# Patient Record
Sex: Female | Born: 1970 | Race: Black or African American | Hispanic: No | Marital: Single | Smoking: Current every day smoker
Health system: Southern US, Community
[De-identification: ages and names within clinical notes are randomized; demographics above are authoritative.]

## PROBLEM LIST (undated history)

## (undated) DIAGNOSIS — Z21 Asymptomatic human immunodeficiency virus [HIV] infection status: Secondary | ICD-10-CM

## (undated) DIAGNOSIS — B20 Human immunodeficiency virus [HIV] disease: Secondary | ICD-10-CM

---

## 2020-07-20 ENCOUNTER — Encounter (HOSPITAL_BASED_OUTPATIENT_CLINIC_OR_DEPARTMENT_OTHER): Payer: Self-pay | Admitting: Emergency Medicine

## 2020-07-20 ENCOUNTER — Emergency Department (HOSPITAL_BASED_OUTPATIENT_CLINIC_OR_DEPARTMENT_OTHER)
Admission: EM | Admit: 2020-07-20 | Discharge: 2020-07-20 | Disposition: A | Discharge status: Home / Self Care | Payer: Medicaid Other | Attending: Emergency Medicine | Admitting: Emergency Medicine

## 2020-07-20 ENCOUNTER — Telehealth (HOSPITAL_BASED_OUTPATIENT_CLINIC_OR_DEPARTMENT_OTHER): Payer: Self-pay | Admitting: *Deleted

## 2020-07-20 ENCOUNTER — Other Ambulatory Visit: Payer: Self-pay

## 2020-07-20 DIAGNOSIS — F17200 Nicotine dependence, unspecified, uncomplicated: Secondary | ICD-10-CM | POA: Diagnosis not present

## 2020-07-20 DIAGNOSIS — U071 COVID-19: Secondary | ICD-10-CM | POA: Diagnosis not present

## 2020-07-20 DIAGNOSIS — J069 Acute upper respiratory infection, unspecified: Secondary | ICD-10-CM | POA: Diagnosis not present

## 2020-07-20 DIAGNOSIS — Z21 Asymptomatic human immunodeficiency virus [HIV] infection status: Secondary | ICD-10-CM | POA: Diagnosis not present

## 2020-07-20 DIAGNOSIS — R05 Cough: Secondary | ICD-10-CM | POA: Diagnosis present

## 2020-07-20 HISTORY — DX: Human immunodeficiency virus (HIV) disease: B20

## 2020-07-20 HISTORY — DX: Asymptomatic human immunodeficiency virus (hiv) infection status: Z21

## 2020-07-20 LAB — SARS CORONAVIRUS 2 BY RT PCR (HOSPITAL ORDER, PERFORMED IN ~~LOC~~ HOSPITAL LAB): SARS Coronavirus 2: POSITIVE — AB

## 2020-07-20 MED ORDER — ACETAMINOPHEN 325 MG PO TABS
650.0000 mg | ORAL_TABLET | Freq: Once | ORAL | Status: AC
  Administered 2020-07-20: 650 mg via ORAL
  Filled 2020-07-20: qty 2

## 2020-07-20 NOTE — ED Provider Notes (Signed)
[facility] EMERGENCY DEPARTMENT Provider Note   CSN: [id number] Arrival date & time: 07/20/20  1721     History Chief Complaint  Patient presents with  . Generalized Body Aches    Jenna Jenkins is a 49 y.o. female.  49 year old female presents with complaint of cough, congestion, body aches, diarrhea.  Patient states symptoms started yesterday, exposed to her daughter who she thought just had a cold.  Patient is taking Alka-Seltzer yesterday without relief of her symptoms, continued to feel unwell today so she came to be seen.  Patient has not had her Covid vaccine, no other sick contacts.  Patient has a history of HIV, compliant with medication and follow-up with good control.  No history of asthma or chronic lung disease.        Past Medical History:  Diagnosis Date  . HIV (human immunodeficiency virus infection) (HCC)     There are no problems to display for this patient.   History reviewed. No pertinent surgical history.   OB History   No obstetric history on file.     No family history on file.  Social History   Tobacco Use  . Smoking status: Current Every Day Smoker  . Smokeless tobacco: Never Used  Substance Use Topics  . Alcohol use: Never  . Drug use: Never    Home Medications Prior to Admission medications   Not on File    Allergies    Patient has no known allergies.  Review of Systems   Review of Systems  Constitutional: Positive for chills. Negative for fever.  HENT: Positive for congestion. Negative for sore throat.   Respiratory: Positive for cough. Negative for shortness of breath.   Gastrointestinal: Positive for diarrhea. Negative for abdominal pain, nausea and vomiting.  Genitourinary: Negative for dysuria.  Musculoskeletal: Positive for arthralgias and myalgias.  Skin: Negative for rash and wound.  Allergic/Immunologic: Positive for immunocompromised state.  Neurological: Negative for weakness.  Hematological: Negative  for adenopathy.  All other systems reviewed and are negative.   Physical Exam Updated Vital Signs BP 115/78 (BP Location: Right Arm)   Pulse 99   Temp 98.9 F (37.2 C) (Oral)   Resp 18   Ht 6\' 1"  (1.854 m)   Wt (!) 122.5 kg   SpO2 99%   BMI 35.62 kg/m   Physical Exam Vitals and nursing note reviewed.  Constitutional:      General: She is not in acute distress.    Appearance: She is well-developed. She is not diaphoretic.  HENT:     Head: Normocephalic and atraumatic.     Right Ear: Tympanic membrane and ear canal normal.     Left Ear: Tympanic membrane and ear canal normal.  Cardiovascular:     Rate and Rhythm: Normal rate and regular rhythm.     Pulses: Normal pulses.     Heart sounds: Normal heart sounds.  Pulmonary:     Effort: Pulmonary effort is normal.     Breath sounds: Normal breath sounds.  Musculoskeletal:     Right lower leg: No edema.     Left lower leg: No edema.  Skin:    General: Skin is warm and dry.     Findings: No erythema or rash.  Neurological:     Mental Status: She is alert and oriented to person, place, and time.  Psychiatric:        Behavior: Behavior normal.     ED Results / Procedures / Treatments   Labs (  all labs ordered are listed, but only abnormal results are displayed) Labs Reviewed  SARS CORONAVIRUS 2 BY RT PCR (HOSPITAL ORDER, PERFORMED IN [facility])    EKG None  Radiology No results found.  Procedures Procedures (including critical care time)  Medications Ordered in ED Medications  acetaminophen (TYLENOL) tablet 650 mg (has no administration in time range)    ED Course  I have reviewed the triage vital signs and the nursing notes.  Pertinent labs & imaging results that were available during my care of the patient were reviewed by me and considered in my medical decision making (see chart for details).  Clinical Course as of Jul 20 2034  Wynelle Link Jul 20, 2020  [id number] 49 year old female with complaint  of cough, body aches, diarrhea. No loss of smell or taste. On exam, patient is well appearing, lungs clear, vitals reassuring. Patient will be tested for COVID, advised to quarantine pending her results.     Jenna Jenkins was evaluated in Emergency Department on 07/20/2020 for the symptoms described in the history of present illness. She was evaluated in the context of the global COVID-19 pandemic, which necessitated consideration that the patient might be at risk for infection with the SARS-CoV-2 virus that causes COVID-19. Institutional protocols and algorithms that pertain to the evaluation of patients at risk for COVID-19 are in a state of rapid change based on information released by regulatory bodies including the CDC and federal and state organizations. These policies and algorithms were followed during the patient's care in the ED.     [LM]    Clinical Course User Index [LM] Alden Hipp   MDM Rules/Calculators/A&P                          Final Clinical Impression(s) / ED Diagnoses Final diagnoses:  Viral upper respiratory tract infection    Rx / DC Orders ED Discharge Orders    None       Jeannie Fend, PA-C 07/20/20 2035    Melene Plan, DO 07/20/20 2041

## 2020-07-20 NOTE — Discharge Instructions (Addendum)
Home to rest and quarantine pending your Covid test results.  Take Tylenol as needed as directed for body aches. Follow-up with your doctor for worsening or concerning symptoms, return to ER as needed.

## 2020-07-20 NOTE — ED Triage Notes (Signed)
Patient states that her daughter recently had a "cold" and the patient started to feel bad yesterday. The patient states that this am she woke up feeling worse with generalized body. Denies any N/V, chills.

## 2020-07-20 NOTE — ED Notes (Signed)
Pt reports shortness of breath, requesting oxygen in lobby. Triage chart reviewed, portable pulse ox utilized to examine patient in lobby. Speaking in full sentences, breathing easy and unlabored. HR 99, SpO2 on room air 98-99%. Explained wait times to patient, who wants to go home ow. Encouraged to stay as wait times vary quickly. Pt agreeable.

## 2020-07-22 ENCOUNTER — Other Ambulatory Visit: Payer: Self-pay | Admitting: Adult Health

## 2020-07-22 DIAGNOSIS — U071 COVID-19: Secondary | ICD-10-CM

## 2020-07-22 NOTE — Progress Notes (Signed)
I connected by phone with Jenna Jenkins on 07/22/2020 at 8:24 PM to discuss the potential use of a new treatment for mild to moderate COVID-19 viral infection in non-hospitalized patients.  This patient is a 49 y.o. female that meets the FDA criteria for Emergency Use Authorization of COVID monoclonal antibody casirivimab/imdevimab.  Has a (+) direct SARS-CoV-2 viral test result  Has mild or moderate COVID-19   Is NOT hospitalized due to COVID-19  Is within 10 days of symptom onset  Has at least one of the high risk factor(s) for progression to severe COVID-19 and/or hospitalization as defined in EUA.  Specific high risk criteria : BMI > 25   I have spoken and communicated the following to the patient or parent/caregiver regarding COVID monoclonal antibody treatment:  1. FDA has authorized the emergency use for the treatment of mild to moderate COVID-19 in adults and pediatric patients with positive results of direct SARS-CoV-2 viral testing who are 47 years of age and older weighing at least 40 kg, and who are at high risk for progressing to severe COVID-19 and/or hospitalization.  2. The significant known and potential risks and benefits of COVID monoclonal antibody, and the extent to which such potential risks and benefits are unknown.  3. Information on available alternative treatments and the risks and benefits of those alternatives, including clinical trials.  4. Patients treated with COVID monoclonal antibody should continue to self-isolate and use infection control measures (e.g., wear mask, isolate, social distance, avoid sharing personal items, clean and disinfect "high touch" surfaces, and frequent handwashing) according to CDC guidelines.   5. The patient or parent/caregiver has the option to accept or refuse COVID monoclonal antibody treatment.  After reviewing this information with the patient, The patient agreed to proceed with receiving casirivimab\imdevimab infusion and  will be provided a copy of the Fact sheet prior to receiving the infusion. Noreene Filbert 07/22/2020 8:24 PM

## 2020-07-23 MED ORDER — SODIUM CHLORIDE 0.9 % IV SOLN
Freq: Once | INTRAVENOUS | Status: AC
  Filled 2020-07-23: qty 600

## 2020-07-24 ENCOUNTER — Ambulatory Visit (HOSPITAL_COMMUNITY)
Admission: RE | Admit: 2020-07-24 | Discharge: 2020-07-24 | Disposition: A | Discharge status: Home / Self Care | Payer: Medicaid Other | Source: Ambulatory Visit | Attending: Pulmonary Disease | Admitting: Pulmonary Disease

## 2020-07-24 DIAGNOSIS — U071 COVID-19: Secondary | ICD-10-CM | POA: Diagnosis not present

## 2020-07-24 MED ORDER — DIPHENHYDRAMINE HCL 50 MG/ML IJ SOLN: 50.0000 mg | Freq: Once | INTRAMUSCULAR | Status: DC | PRN

## 2020-07-24 MED ORDER — SODIUM CHLORIDE 0.9 % IV SOLN: INTRAVENOUS | Status: DC | PRN

## 2020-07-24 MED ORDER — FAMOTIDINE IN NACL 20-0.9 MG/50ML-% IV SOLN: 20.0000 mg | Freq: Once | INTRAVENOUS | Status: DC | PRN

## 2020-07-24 MED ORDER — EPINEPHRINE 0.3 MG/0.3ML IJ SOAJ: 0.3000 mg | Freq: Once | INTRAMUSCULAR | Status: DC | PRN

## 2020-07-24 MED ORDER — ALBUTEROL SULFATE HFA 108 (90 BASE) MCG/ACT IN AERS: 2.0000 | INHALATION_SPRAY | Freq: Once | RESPIRATORY_TRACT | Status: DC | PRN

## 2020-07-24 MED ORDER — METHYLPREDNISOLONE SODIUM SUCC 125 MG IJ SOLR: 125.0000 mg | Freq: Once | INTRAMUSCULAR | Status: DC | PRN

## 2020-07-24 NOTE — Progress Notes (Signed)
°  Diagnosis: COVID-19 ° °Physician:Dr Wright ° °Procedure: [facility] Infusion [facility] Med: casirivimab\imdevimab infusion - Provided patient with casirivimab\imdevimab fact sheet for patients, parents and caregivers prior to infusion. ° °Complications: No immediate complications noted. ° °Discharge: Discharged home  ° °Jenna Jenkins °07/24/2020 ° °

## 2020-07-24 NOTE — Discharge Instructions (Signed)

## 2020-10-24 ENCOUNTER — Encounter (HOSPITAL_BASED_OUTPATIENT_CLINIC_OR_DEPARTMENT_OTHER): Payer: Self-pay | Admitting: Emergency Medicine

## 2020-10-24 ENCOUNTER — Emergency Department (HOSPITAL_BASED_OUTPATIENT_CLINIC_OR_DEPARTMENT_OTHER): Payer: Medicaid Other

## 2020-10-24 ENCOUNTER — Emergency Department (HOSPITAL_BASED_OUTPATIENT_CLINIC_OR_DEPARTMENT_OTHER)
Admission: EM | Admit: 2020-10-24 | Discharge: 2020-10-24 | Disposition: A | Discharge status: Home / Self Care | Payer: Medicaid Other | Attending: Emergency Medicine | Admitting: Emergency Medicine

## 2020-10-24 ENCOUNTER — Other Ambulatory Visit: Payer: Self-pay

## 2020-10-24 DIAGNOSIS — S93401A Sprain of unspecified ligament of right ankle, initial encounter: Secondary | ICD-10-CM | POA: Insufficient documentation

## 2020-10-24 DIAGNOSIS — W108XXA Fall (on) (from) other stairs and steps, initial encounter: Secondary | ICD-10-CM | POA: Diagnosis not present

## 2020-10-24 DIAGNOSIS — F17200 Nicotine dependence, unspecified, uncomplicated: Secondary | ICD-10-CM | POA: Diagnosis not present

## 2020-10-24 DIAGNOSIS — S99911A Unspecified injury of right ankle, initial encounter: Secondary | ICD-10-CM | POA: Diagnosis present

## 2020-10-24 DIAGNOSIS — B20 Human immunodeficiency virus [HIV] disease: Secondary | ICD-10-CM | POA: Insufficient documentation

## 2020-10-24 MED ORDER — NAPROXEN 500 MG PO TABS: 500.0000 mg | ORAL_TABLET | Freq: Two times a day (BID) | ORAL | 0 refills | Status: AC

## 2020-10-24 MED ORDER — NAPROXEN 250 MG PO TABS
500.0000 mg | ORAL_TABLET | Freq: Once | ORAL | Status: AC
  Administered 2020-10-24: 500 mg via ORAL
  Filled 2020-10-24: qty 2

## 2020-10-24 NOTE — ED Provider Notes (Signed)
[facility] EMERGENCY DEPARTMENT Provider Note   CSN: [id number] Arrival date & time: 10/24/20  1014     History Chief Complaint  Patient presents with   Ankle Pain   Fall    Jenna Jenkins is a 49 y.o. female.  Patient presents emergency department for evaluation of right ankle pain starting acutely last night after she fell down 7 or 8 steps.  Patient states that she landed on the ground and twisted her right ankle.  She has had progressively worsening pain in the medial ankle since that time with associated swelling.  No numbness or tingling.  No knee or hip pain.  She did "scrape" her knee.  She denies head or neck injury.  No upper extremity injuries, chest pain, abdominal pain.  No treatments prior to arrival.  Patient states that she can barely put any weight on her right foot.        Past Medical History:  Diagnosis Date   HIV (human immunodeficiency virus infection) (HCC)     There are no problems to display for this patient.   History reviewed. No pertinent surgical history.   OB History   No obstetric history on file.     No family history on file.  Social History   Tobacco Use   Smoking status: Current Every Day Smoker   Smokeless tobacco: Never Used  Substance Use Topics   Alcohol use: Never   Drug use: Never    Home Medications Prior to Admission medications   Not on File    Allergies    Patient has no known allergies.  Review of Systems   Review of Systems  Constitutional: Negative for activity change.  Musculoskeletal: Positive for arthralgias, gait problem and joint swelling. Negative for back pain and neck pain.  Skin: Negative for wound.  Neurological: Negative for weakness and numbness.    Physical Exam Updated Vital Signs BP (!) 143/97 (BP Location: Right Arm)    Pulse 82    Temp 98.2 F (36.8 C)    Resp 18    Ht 6\' 1"  (1.854 m)    Wt 117.5 kg    SpO2 98%    BMI 34.17 kg/m   Physical Exam Vitals and nursing  note reviewed.  Constitutional:      Appearance: She is well-developed.  HENT:     Head: Normocephalic and atraumatic.  Eyes:     Pupils: Pupils are equal, round, and reactive to light.  Cardiovascular:     Pulses: Normal pulses. No decreased pulses.  Musculoskeletal:        General: Tenderness present.     Cervical back: Normal range of motion and neck supple.     Right knee: Normal range of motion. No tenderness.     Right ankle: Swelling (Mild medially) present. No deformity. Tenderness present over the medial malleolus. No proximal fibula tenderness. Decreased range of motion. Normal pulse.  Skin:    General: Skin is warm and dry.  Neurological:     Mental Status: She is alert.     Sensory: No sensory deficit.     Comments: Motor, sensation, and vascular distal to the injury is fully intact.      ED Results / Procedures / Treatments   Labs (all labs ordered are listed, but only abnormal results are displayed) Labs Reviewed - No data to display  EKG None  Radiology No results found.  Procedures Procedures (including critical care time)  Medications Ordered in ED Medications -  No data to display  ED Course  I have reviewed the triage vital signs and the nursing notes.  Pertinent labs & imaging results that were available during my care of the patient were reviewed by me and considered in my medical decision making (see chart for details).  Patient seen and examined.   Vital signs reviewed and are as follows: BP (!) 143/97 (BP Location: Right Arm)    Pulse 82    Temp 98.2 F (36.8 C)    Resp 18    Ht 6\' 1"  (1.854 m)    Wt 117.5 kg    SpO2 98%    BMI 34.17 kg/m   Plan: X-ray  If negative, will place patient in ASO, recommend rice protocol, sports medicine follow-up as needed.  Patient in agreement.  11:17 AM x-ray negative, patient informed of results.  Plan as above.    MDM Rules/Calculators/A&P                          Patient with ankle sprain after a  fall down some stairs yesterday.  No indication of head, neck, chest, or abdomen injury.  Imaging with plain film x-rays negative.  Lower extremity is neurovascularly intact.  Conservative measures as above.   Final Clinical Impression(s) / ED Diagnoses Final diagnoses:  Sprain of right ankle, unspecified ligament, initial encounter    Rx / DC Orders ED Discharge Orders    None       , PA-C 10/24/20 1120    10/26/20, MD 10/24/20 1551

## 2020-10-24 NOTE — Discharge Instructions (Signed)
Please read and follow all provided instructions.  Your diagnoses today include:  1. Sprain of right ankle, unspecified ligament, initial encounter     Tests performed today include:  An x-ray of your ankle - does NOT show any broken bones  Vital signs. See below for your results today.   Medications prescribed:   Naproxen - anti-inflammatory pain medication  Do not exceed 500mg  naproxen every 12 hours, take with food  You have been prescribed an anti-inflammatory medication or NSAID. Take with food. Take smallest effective dose for the shortest duration needed for your pain. Stop taking if you experience stomach pain or vomiting.   Take any prescribed medications only as directed.  Home care instructions:   Follow any educational materials contained in this packet  Follow R.I.C.E. Protocol:  R - rest your injury   I  - use ice on injury without applying directly to skin  C - compress injury with bandage or splint  E - elevate the injury as much as possible  Follow-up instructions: Please follow-up with your primary care provider or the provided orthopedic (bone specialist) if you continue to have significant pain or trouble walking in 1 week. In this case you may have a severe sprain that requires further care.   Return instructions:   Please return if your toes are numb or tingling, appear gray or blue, or you have severe pain (also elevate leg and loosen splint or wrap)  Please return to the Emergency Department if you experience worsening symptoms.   Please return if you have any other emergent concerns.  Additional Information:  Your vital signs today were: BP (!) 143/97 (BP Location: Right Arm)   Pulse 82   Temp 98.2 F (36.8 C)   Resp 18   Ht 6\' 1"  (1.854 m)   Wt 117.5 kg   SpO2 98%   BMI 34.17 kg/m  If your blood pressure (BP) was elevated above 135/85 this visit, please have this repeated by your doctor within one month. -------------- Your  caregiver has diagnosed you as suffering from an ankle sprain. Ankle sprain occurs when the ligaments that hold the ankle joint together are stretched or torn. It may take 4 to 6 weeks to heal.  For Activity: If prescribed crutches, use crutches with non-weight bearing for the first few days. Then, you may walk on your ankle as the pain allows, or as instructed. Start gradually with weight bearing on the affected ankle. Once you can walk pain free, then try jogging. When you can run forwards, then you can try moving side-to-side. If you cannot walk without crutches in one week, you need a re-check. --------------

## 2020-10-24 NOTE — ED Triage Notes (Signed)
Reports she fell down about 8 steps yesterday.  C/o pain in right ankle with swelling noted.

## 2020-10-27 ENCOUNTER — Telehealth: Payer: Self-pay | Admitting: Family Medicine

## 2020-10-27 NOTE — Telephone Encounter (Signed)
Pt decline Ed follow- up appt , says feeling much better & has a boot--will contact office if foot starts hurting again  --gl

## 2020-12-25 ENCOUNTER — Other Ambulatory Visit: Payer: Self-pay

## 2020-12-25 ENCOUNTER — Emergency Department (HOSPITAL_BASED_OUTPATIENT_CLINIC_OR_DEPARTMENT_OTHER)
Admission: EM | Admit: 2020-12-25 | Discharge: 2020-12-25 | Disposition: A | Discharge status: Home / Self Care | Payer: Medicaid Other | Attending: Emergency Medicine | Admitting: Emergency Medicine

## 2020-12-25 ENCOUNTER — Encounter (HOSPITAL_BASED_OUTPATIENT_CLINIC_OR_DEPARTMENT_OTHER): Payer: Self-pay | Admitting: *Deleted

## 2020-12-25 DIAGNOSIS — Z5321 Procedure and treatment not carried out due to patient leaving prior to being seen by health care provider: Secondary | ICD-10-CM | POA: Insufficient documentation

## 2020-12-25 DIAGNOSIS — Z20822 Contact with and (suspected) exposure to covid-19: Secondary | ICD-10-CM | POA: Diagnosis present

## 2020-12-25 NOTE — ED Triage Notes (Signed)
covid exposure with Covid symptoms

## 2022-06-19 IMAGING — DX DG ANKLE COMPLETE 3+V*R*
3 series · 3 of 3 positions shown · non-contrast
Comparison: None.

CLINICAL DATA: Pain following fall

EXAM:
RIGHT ANKLE - COMPLETE 3+ VIEW

[ankle ap]
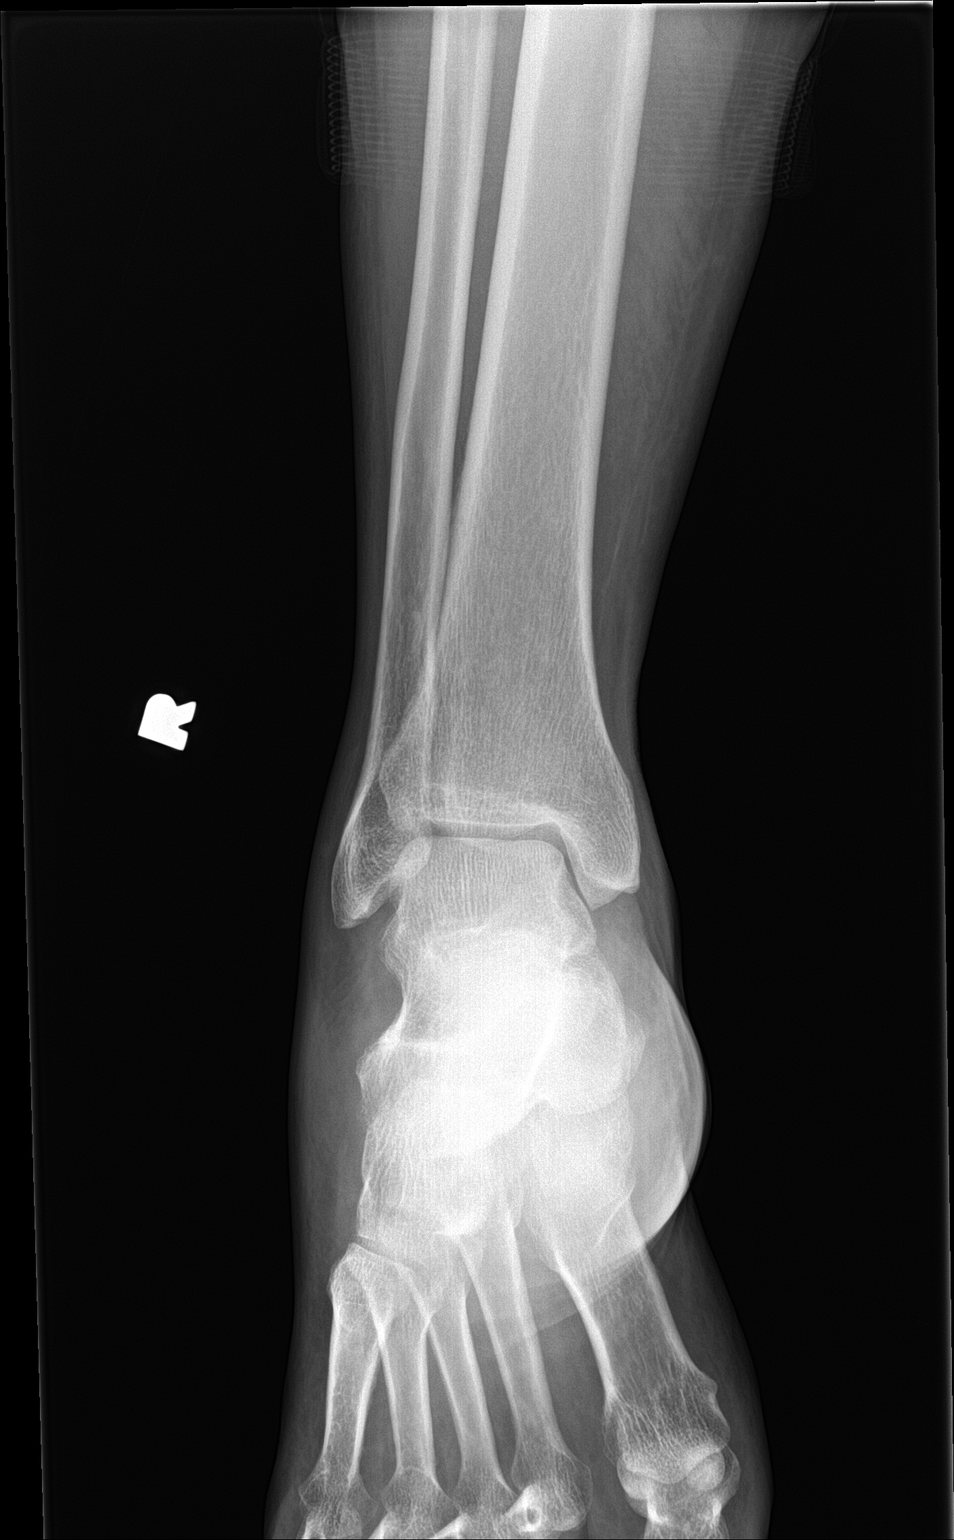

[ankle obl]
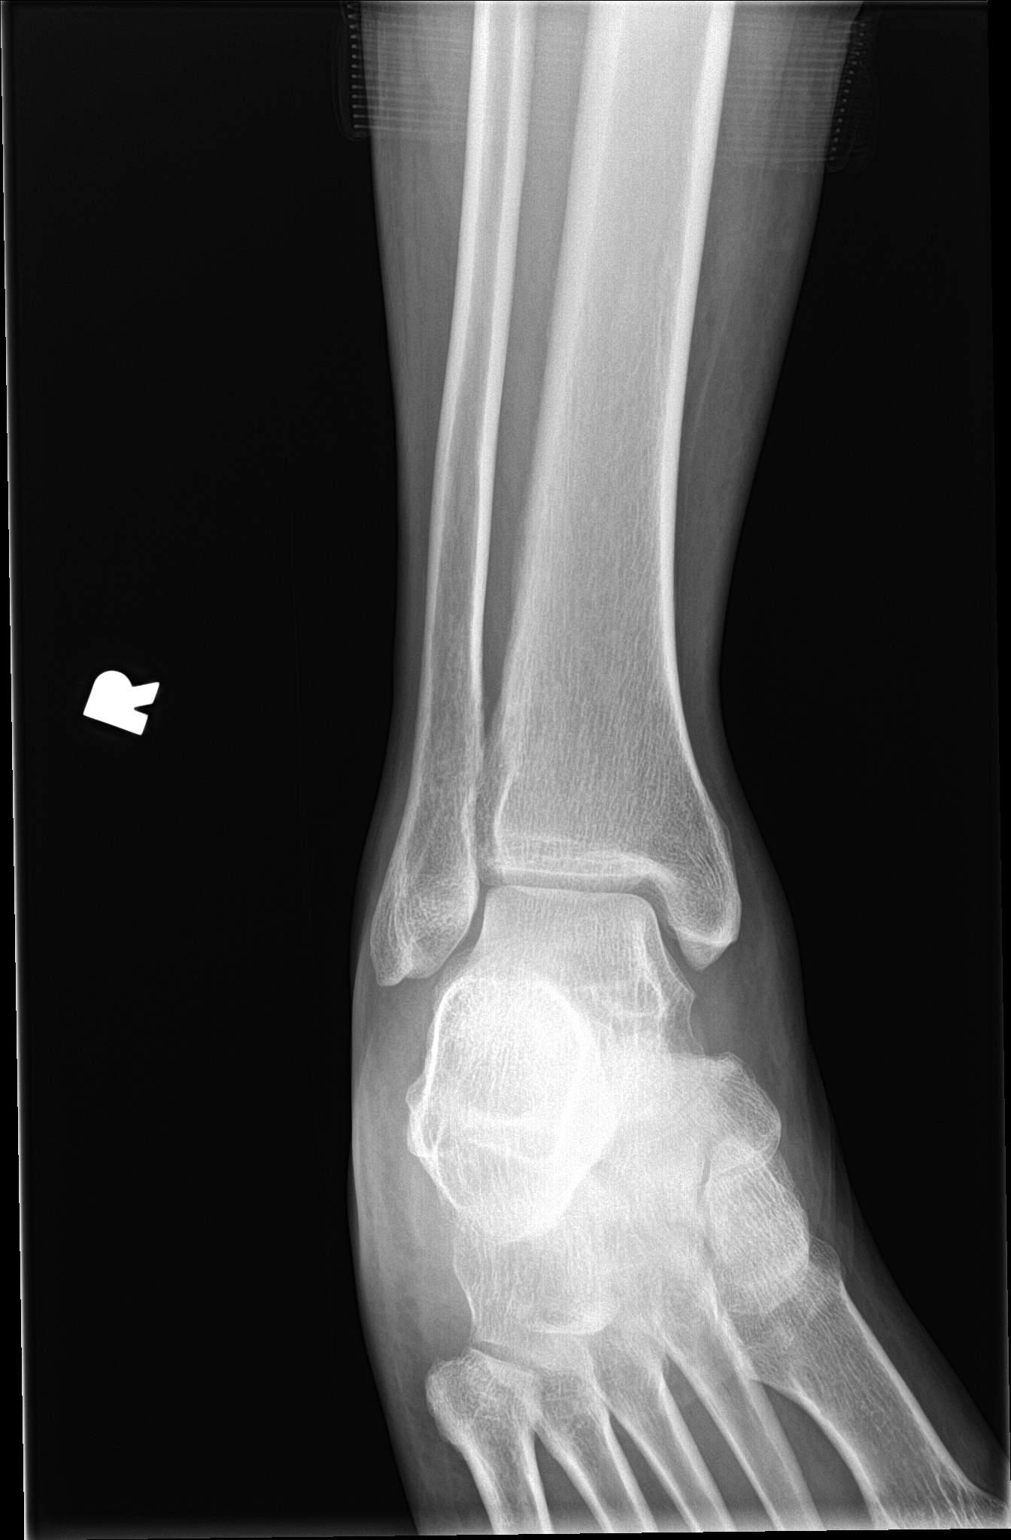

[ankle lat]
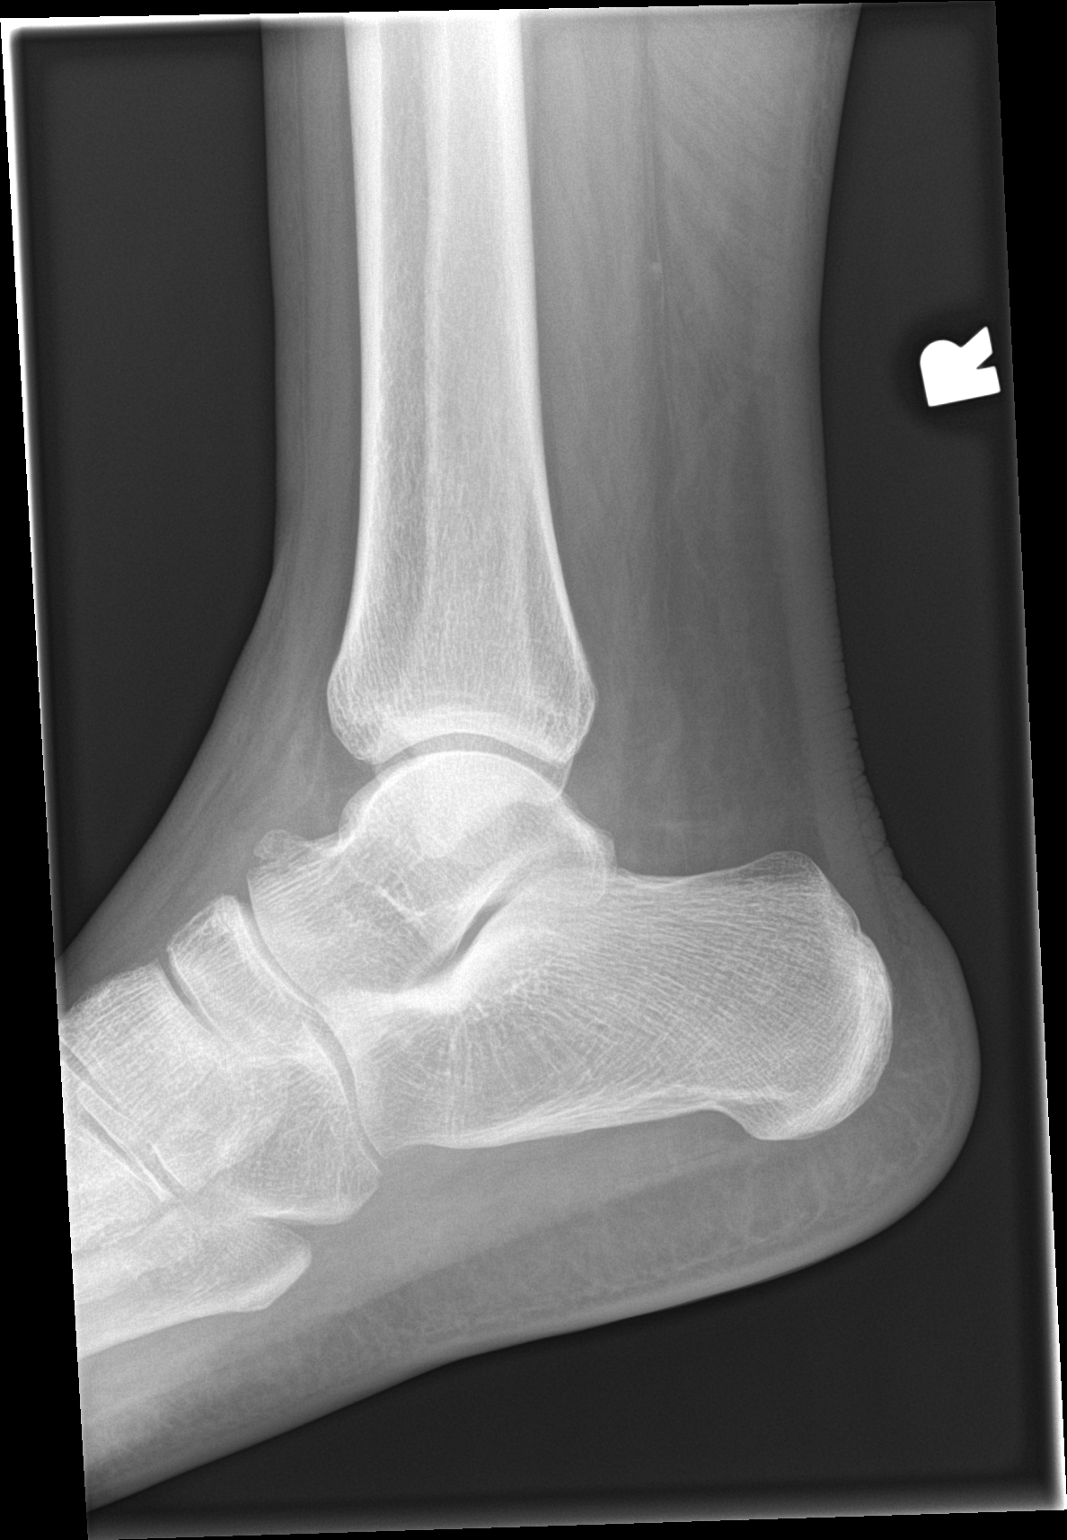

[3 of 3 positions shown; findings below may reference images not displayed]

FINDINGS: Frontal, oblique, and lateral views were obtained. There is no
evident fracture or joint effusion. No joint space narrowing or
erosion. There is a benign exostosis arising from the dorsal distal
talus measuring 8 x 6 mm. Ankle mortise appears intact.
IMPRESSION: No evident fracture or appreciable arthropathy. Small benign
exostosis arising from the dorsal distal talus. Ankle mortise
appears intact.

## 2022-09-14 ENCOUNTER — Encounter (HOSPITAL_BASED_OUTPATIENT_CLINIC_OR_DEPARTMENT_OTHER): Payer: Self-pay | Admitting: Emergency Medicine

## 2022-09-14 ENCOUNTER — Emergency Department (HOSPITAL_BASED_OUTPATIENT_CLINIC_OR_DEPARTMENT_OTHER)
Admission: EM | Admit: 2022-09-14 | Discharge: 2022-09-14 | Disposition: A | Discharge status: Home / Self Care | Payer: Medicaid Other | Attending: Emergency Medicine | Admitting: Emergency Medicine

## 2022-09-14 ENCOUNTER — Emergency Department (HOSPITAL_BASED_OUTPATIENT_CLINIC_OR_DEPARTMENT_OTHER): Payer: Medicaid Other

## 2022-09-14 DIAGNOSIS — Y92410 Unspecified street and highway as the place of occurrence of the external cause: Secondary | ICD-10-CM | POA: Insufficient documentation

## 2022-09-14 DIAGNOSIS — M62838 Other muscle spasm: Secondary | ICD-10-CM | POA: Insufficient documentation

## 2022-09-14 DIAGNOSIS — R519 Headache, unspecified: Secondary | ICD-10-CM | POA: Insufficient documentation

## 2022-09-14 DIAGNOSIS — R0602 Shortness of breath: Secondary | ICD-10-CM | POA: Insufficient documentation

## 2022-09-14 DIAGNOSIS — M542 Cervicalgia: Secondary | ICD-10-CM | POA: Diagnosis present

## 2022-09-14 MED ORDER — LIDOCAINE 5 % EX PTCH: 1.0000 | MEDICATED_PATCH | CUTANEOUS | 0 refills | Status: AC

## 2022-09-14 MED ORDER — CYCLOBENZAPRINE HCL 10 MG PO TABS: 10.0000 mg | ORAL_TABLET | Freq: Two times a day (BID) | ORAL | 0 refills | Status: AC | PRN
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
## 2023-03-30 ENCOUNTER — Other Ambulatory Visit: Payer: Self-pay | Admitting: Physician Assistant

## 2023-03-30 DIAGNOSIS — Z1231 Encounter for screening mammogram for malignant neoplasm of breast: Secondary | ICD-10-CM

## 2023-04-13 ENCOUNTER — Encounter: Payer: Self-pay | Admitting: *Deleted

## 2023-05-05 ENCOUNTER — Ambulatory Visit
Admission: RE | Admit: 2023-05-05 | Discharge: 2023-05-05 | Disposition: A | Discharge status: Home / Self Care | Payer: Medicaid Other | Source: Ambulatory Visit | Attending: Physician Assistant | Admitting: Physician Assistant

## 2023-05-05 DIAGNOSIS — Z1231 Encounter for screening mammogram for malignant neoplasm of breast: Secondary | ICD-10-CM

## 2023-11-08 LAB — HM PAP SMEAR

## 2024-01-13 ENCOUNTER — Emergency Department (HOSPITAL_BASED_OUTPATIENT_CLINIC_OR_DEPARTMENT_OTHER)
Admission: EM | Admit: 2024-01-13 | Discharge: 2024-01-13 | Disposition: A | Discharge status: Home / Self Care | Payer: Medicaid Other | Attending: Emergency Medicine | Admitting: Emergency Medicine

## 2024-01-13 ENCOUNTER — Other Ambulatory Visit: Payer: Self-pay

## 2024-01-13 ENCOUNTER — Encounter (HOSPITAL_BASED_OUTPATIENT_CLINIC_OR_DEPARTMENT_OTHER): Payer: Self-pay | Admitting: Emergency Medicine

## 2024-01-13 DIAGNOSIS — Z21 Asymptomatic human immunodeficiency virus [HIV] infection status: Secondary | ICD-10-CM | POA: Insufficient documentation

## 2024-01-13 DIAGNOSIS — Z20822 Contact with and (suspected) exposure to covid-19: Secondary | ICD-10-CM | POA: Diagnosis not present

## 2024-01-13 DIAGNOSIS — R197 Diarrhea, unspecified: Secondary | ICD-10-CM | POA: Insufficient documentation

## 2024-01-13 DIAGNOSIS — R6883 Chills (without fever): Secondary | ICD-10-CM | POA: Insufficient documentation

## 2024-01-13 DIAGNOSIS — R112 Nausea with vomiting, unspecified: Secondary | ICD-10-CM | POA: Diagnosis present

## 2024-01-13 DIAGNOSIS — M791 Myalgia, unspecified site: Secondary | ICD-10-CM | POA: Insufficient documentation

## 2024-01-13 LAB — URINALYSIS, ROUTINE W REFLEX MICROSCOPIC
Glucose, UA: NEGATIVE mg/dL
Hgb urine dipstick: NEGATIVE
Ketones, ur: NEGATIVE mg/dL
Leukocytes,Ua: NEGATIVE
Nitrite: NEGATIVE
Protein, ur: 30 mg/dL — AB
Specific Gravity, Urine: >=1.03 (ref 1.005–1.030)
pH: 6 (ref 5.0–8.0)

## 2024-01-13 LAB — RESP PANEL BY RT-PCR (RSV, FLU A&B, COVID)  RVPGX2
Influenza A by PCR: NEGATIVE
Influenza B by PCR: NEGATIVE
Resp Syncytial Virus by PCR: NEGATIVE
SARS Coronavirus 2 by RT PCR: NEGATIVE

## 2024-01-13 LAB — URINALYSIS, MICROSCOPIC (REFLEX)

## 2024-01-13 LAB — COMPREHENSIVE METABOLIC PANEL
ALT: 18 U/L (ref 0–44)
AST: 16 U/L (ref 15–41)
Albumin: 4 g/dL (ref 3.5–5.0)
Alkaline Phosphatase: 64 U/L (ref 38–126)
Anion gap: 10 (ref 5–15)
BUN: 9 mg/dL (ref 6–20)
CO2: 23 mmol/L (ref 22–32)
Calcium: 8.9 mg/dL (ref 8.9–10.3)
Chloride: 105 mmol/L (ref 98–111)
Creatinine, Ser: 0.96 mg/dL (ref 0.44–1.00)
GFR, Estimated: >60 mL/min (ref >60)
Glucose, Bld: 138 mg/dL — ABNORMAL HIGH (ref 70–99)
Potassium: 3.9 mmol/L (ref 3.5–5.1)
Sodium: 138 mmol/L (ref 135–145)
Total Bilirubin: 0.7 mg/dL (ref 0.0–1.2)
Total Protein: 7.7 g/dL (ref 6.5–8.1)

## 2024-01-13 LAB — CBC
HCT: 43.3 % (ref 36.0–46.0)
Hemoglobin: 14.3 g/dL (ref 12.0–15.0)
MCH: 30.8 pg (ref 26.0–34.0)
MCHC: 33 g/dL (ref 30.0–36.0)
MCV: 93.1 fL (ref 80.0–100.0)
Platelets: 242 10*3/uL (ref 150–400)
RBC: 4.65 MIL/uL (ref 3.87–5.11)
RDW: 12.9 % (ref 11.5–15.5)
WBC: 8.9 10*3/uL (ref 4.0–10.5)
nRBC: 0 % (ref 0.0–0.2)

## 2024-01-13 LAB — LIPASE, BLOOD: Lipase: 24 U/L (ref 11–51)

## 2024-01-13 MED ORDER — ONDANSETRON 4 MG PO TBDP: 4.0000 mg | ORAL_TABLET | Freq: Three times a day (TID) | ORAL | 0 refills | Status: AC | PRN

## 2024-01-13 MED ORDER — ONDANSETRON 4 MG PO TBDP
4.0000 mg | ORAL_TABLET | Freq: Once | ORAL | Status: AC
  Administered 2024-01-13: 4 mg via ORAL
  Filled 2024-01-13: qty 1

## 2024-01-13 NOTE — Discharge Instructions (Addendum)
You have been seen today for your complaint of nausea, vomiting and diarrhea. Your lab work was reassuring. Your discharge medications include Zofran.  This is a nausea medicine.  Take this as needed in order to eat and drink normal diet. Home care instructions are as follows:  Drink plenty of water.  Eat a bland diet like bananas, rice, apples and toast for 1 to 2 days Follow up with: Your primary care provider Please seek immediate medical care if you develop any of the following symptoms: You have chest pain. You have trouble breathing, or you are breathing very fast. You have a fast heartbeat. You feel very weak or you faint. You have a very bad headache, a stiff neck, or both. You have a rash. You have very bad pain, cramping, or bloating in your belly. Your skin feels cold and clammy. You feel mixed up (confused). You have pain when you pee. You have signs of not having enough water in the body, such as: Dark pee, hardly any pee, or no pee. Cracked lips. Dry mouth. Sunken eyes. Feeling very sleepy. Feeling weak. You have signs of bleeding, such as: You see blood in your vomit. Your vomit looks like coffee grounds. You have bloody or black poop or poop that looks like tar. At this time there does not appear to be the presence of an emergent medical condition, however there is always the potential for conditions to change. Please read and follow the below instructions.  Do not take your medicine if  develop an itchy rash, swelling in your mouth or lips, or difficulty breathing; call 911 and seek immediate emergency medical attention if this occurs.  You may review your lab tests and imaging results in their entirety on your MyChart account.  Please discuss all results of fully with your primary care provider and other specialist at your follow-up visit.  Note: Portions of this text may have been transcribed using voice recognition software. Every effort was made to ensure  accuracy; however, inadvertent computerized transcription errors may still be present.

## 2024-01-13 NOTE — ED Triage Notes (Signed)
Pt arrived via EMS, c/o of n/v/d, chills & body aches since yesterday; reports having a coworker dx with COVID and has a grandson that has been sick, denies ShoB, congestion, cough or HA

## 2024-01-13 NOTE — ED Provider Notes (Signed)
[facility] EM[facility]GENCY DEPARTMENT AT [facility] Provider Note   CSN: [id number] Arrival date & time: 01/13/24  1300     History  Chief Complaint  Patient presents with   Emesis   Diarrhea    Jenna Jenkins is a 53 y.o. female.  With a history of HIV presenting to the ED for evaluation of nausea, vomiting, diarrhea, chills, body aches.  Symptoms began yesterday.  Reports a coworker was recently diagnosed with COVID.  States his grandson also had some vomiting and diarrhea yesterday.  She reports numerous episodes of nonbloody, nonbilious emesis and nonbloody diarrhea beginning at 4:30 AM today.  Symptoms have improved and she has not had any emesis or diarrhea for the past 4 hours, however still feels nauseous.  She reports generalized abdominal cramping pain.  She denies dysuria, frequency, urgency, hematuria.  She denies any fevers.  She feels dehydrated.  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  She reports chronic congestion and rhinorrhea.  No recent change.   Emesis Associated symptoms: diarrhea   Diarrhea Associated symptoms: vomiting        Home Medications Prior to Admission medications   Medication Sig Start Date End Date Taking? Authorizing Provider  ondansetron (ZOFRAN-ODT) 4 MG disintegrating tablet Take 1 tablet (4 mg total) by mouth every 8 (eight) hours as needed for nausea or vomiting. 01/13/24  Yes Danicia Terhaar, Edsel Petrin, PA-C  cyclobenzaprine (FLEXERIL) 10 MG tablet Take 1 tablet (10 mg total) by mouth 2 (two) times daily as needed for muscle spasms. 09/14/22   Tegeler, Canary Brim, MD  lidocaine (LIDODERM) 5 % Place 1 patch onto the skin daily. Remove & Discard patch within 12 hours or as directed by MD 09/14/22   Tegeler, Canary Brim, MD  naproxen (NAPROSYN) 500 MG tablet Take 1 tablet (500 mg total) by mouth 2 (two) times daily. 10/24/20   Renne Crigler, PA-C      Allergies    Patient has no known allergies.     Review of Systems   Review of Systems  Gastrointestinal:  Positive for diarrhea, nausea and vomiting.  All other systems reviewed and are negative.   Physical Exam Updated Vital Signs BP 102/71 (BP Location: Right Arm)   Pulse 88   Temp 97.8 F (36.6 C)   Resp 18   Ht 6\' 1"  (1.854 m)   Wt 115.7 kg   SpO2 100%   BMI 33.64 kg/m  Physical Exam Vitals and nursing note reviewed.  Constitutional:      General: She is not in acute distress.    Appearance: Normal appearance. She is obese. She is not ill-appearing.     Comments: Resting comfortably in bed  HENT:     Head: Normocephalic and atraumatic.  Pulmonary:     Effort: Pulmonary effort is normal. No respiratory distress.  Abdominal:     General: Abdomen is flat.  Musculoskeletal:        General: Normal range of motion.     Cervical back: Neck supple.  Skin:    General: Skin is warm and dry.  Neurological:     Mental Status: She is alert and oriented to person, place, and time.  Psychiatric:        Mood and Affect: Mood normal.        Behavior: Behavior normal.     ED Results / Procedures / Treatments   Labs (all labs ordered are  listed, but only abnormal results are displayed) Labs Reviewed  COMPREHENSIVE METABOLIC PANEL - Abnormal; Notable for the following components:      Result Value   Glucose, Bld 138 (*)    All other components within normal limits  URINALYSIS, ROUTINE W REFLEX MICROSCOPIC - Abnormal; Notable for the following components:   Bilirubin Urine SMALL (*)    Protein, ur 30 (*)    All other components within normal limits  URINALYSIS, MICROSCOPIC (REFLEX) - Abnormal; Notable for the following components:   Bacteria, UA FEW (*)    All other components within normal limits  RESP PANEL BY RT-PCR (RSV, FLU A&B, COVID)  RVPGX2  LIPASE, BLOOD  CBC    EKG None  Radiology No results found.  Procedures Procedures    Medications Ordered in ED Medications  ondansetron (ZOFRAN-ODT)  disintegrating tablet 4 mg (4 mg Oral Given 01/13/24 1521)    ED Course/ Medical Decision Making/ A&P                                 Medical Decision Making Amount and/or Complexity of Data Reviewed Labs: ordered.  Risk Prescription drug management.  This patient presents to the ED for concern of nausea, vomiting, diarrhea, this involves an extensive number of treatment options, and is a complaint that carries with it a high risk of complications and morbidity.  The emergent differential diagnosis for vomiting includes, but is not limited to ACS/MI, DKA, Ischemic bowel, Meningitis, Sepsis, Acute gastric dilation, Adrenal insufficiency, Appendicitis,  Bowel obstruction/ileus, Carbon monoxide poisoning, Cholecystitis, Electrolyte abnormalities, Elevated ICP, Gastric outlet obstruction, Pancreatitis, Ruptured viscus, Biliary colic, Cannabinoid hyperemesis syndrome, Gastritis, Gastroenteritis, Gastroparesis,  Narcotic withdrawal, Peptic ulcer disease, and UTI   My initial workup includes labs, Zofran, oral rehydration  Additional history obtained from: Nursing notes from this visit. EMS provides portion of the history  I ordered, reviewed and interpreted labs which include: CBC, CMP, lipase, respiratory panel.  No significant electrolyte derangement or kidney dysfunction.  Normal LFTs.  Respiratory panel negative.  No leukocytosis or anemia.  Normal lipase.  Afebrile, hemodynamically stable.  53 year old female presenting to the ED for evaluation of nausea, vomiting and diarrhea.  She also reports generalized abdominal cramping.  Symptoms began this morning.  She reports her grandson had similar symptoms yesterday and she has a coworker that was recently diagnosed with COVID.  On my assessment she has not had any vomiting or diarrhea for multiple hours.  She still feels nauseous.  Laboratory workup was overall very reassuring.  No significant dehydration.  No urinary complaints.  Overall suspect  viral gastroenteritis.  She tolerated oral intake without difficulty in the emergency department.  Will send prescription for Zofran for her symptoms.  She was educated on the brat diet.  She was encouraged to drink plenty of liquids.  She was given return precautions.  Stable at discharge.  At this time there does not appear to be any evidence of an acute emergency medical condition and the patient appears stable for discharge with appropriate outpatient follow up. Diagnosis was discussed with patient who verbalizes understanding of care plan and is agreeable to discharge. I have discussed return precautions with patient who verbalizes understanding. Patient encouraged to follow-up with their PCP within 1 week. All questions answered.  Note: Portions of this report may have been transcribed using voice recognition software. Every effort was made to ensure accuracy; however, inadvertent computerized transcription  errors may still be present.         Final Clinical Impression(s) / ED Diagnoses Final diagnoses:  Nausea vomiting and diarrhea    Rx / DC Orders ED Discharge Orders          Ordered    ondansetron (ZOFRAN-ODT) 4 MG disintegrating tablet  Every 8 hours PRN        01/13/24 1625              Michelle Piper, Cordelia Poche 01/13/24 1645    Tegeler, Canary Brim, MD 01/13/24 1925

## 2024-01-13 NOTE — ED Notes (Signed)
She wants a Drs note

## 2024-03-27 ENCOUNTER — Encounter: Payer: Self-pay | Admitting: Physician Assistant

## 2024-04-18 ENCOUNTER — Other Ambulatory Visit: Payer: Self-pay

## 2024-04-19 ENCOUNTER — Ambulatory Visit: Admitting: Obstetrics and Gynecology

## 2024-05-24 ENCOUNTER — Other Ambulatory Visit: Payer: Self-pay | Admitting: Physician Assistant

## 2024-05-24 DIAGNOSIS — Z1231 Encounter for screening mammogram for malignant neoplasm of breast: Secondary | ICD-10-CM

## 2024-05-29 ENCOUNTER — Ambulatory Visit
Admission: RE | Admit: 2024-05-29 | Discharge: 2024-05-29 | Disposition: A | Discharge status: Home / Self Care | Source: Ambulatory Visit | Attending: Physician Assistant | Admitting: Physician Assistant

## 2024-05-29 DIAGNOSIS — Z1231 Encounter for screening mammogram for malignant neoplasm of breast: Secondary | ICD-10-CM

## 2024-06-04 ENCOUNTER — Other Ambulatory Visit: Payer: Self-pay | Admitting: Physician Assistant

## 2024-06-04 DIAGNOSIS — R928 Other abnormal and inconclusive findings on diagnostic imaging of breast: Secondary | ICD-10-CM

## 2024-06-12 ENCOUNTER — Inpatient Hospital Stay: Admission: RE | Admit: 2024-06-12 | Source: Ambulatory Visit

## 2024-06-12 ENCOUNTER — Encounter

## 2024-06-14 ENCOUNTER — Other Ambulatory Visit

## 2024-06-14 ENCOUNTER — Encounter

## 2024-06-19 ENCOUNTER — Encounter

## 2024-06-19 ENCOUNTER — Other Ambulatory Visit

## 2024-06-21 ENCOUNTER — Other Ambulatory Visit

## 2024-06-21 ENCOUNTER — Encounter

## 2024-08-07 ENCOUNTER — Ambulatory Visit
Admission: RE | Admit: 2024-08-07 | Discharge: 2024-08-07 | Disposition: A | Discharge status: Home / Self Care | Source: Ambulatory Visit | Attending: Physician Assistant | Admitting: Physician Assistant

## 2024-08-07 ENCOUNTER — Other Ambulatory Visit: Payer: Self-pay

## 2024-08-07 ENCOUNTER — Emergency Department (HOSPITAL_BASED_OUTPATIENT_CLINIC_OR_DEPARTMENT_OTHER)
Admission: EM | Admit: 2024-08-07 | Discharge: 2024-08-07 | Disposition: A | Discharge status: Home / Self Care | Attending: Emergency Medicine | Admitting: Emergency Medicine

## 2024-08-07 ENCOUNTER — Other Ambulatory Visit

## 2024-08-07 ENCOUNTER — Emergency Department (HOSPITAL_BASED_OUTPATIENT_CLINIC_OR_DEPARTMENT_OTHER)

## 2024-08-07 ENCOUNTER — Encounter (HOSPITAL_BASED_OUTPATIENT_CLINIC_OR_DEPARTMENT_OTHER): Payer: Self-pay | Admitting: Urology

## 2024-08-07 ENCOUNTER — Encounter

## 2024-08-07 DIAGNOSIS — Z21 Asymptomatic human immunodeficiency virus [HIV] infection status: Secondary | ICD-10-CM | POA: Insufficient documentation

## 2024-08-07 DIAGNOSIS — F17210 Nicotine dependence, cigarettes, uncomplicated: Secondary | ICD-10-CM | POA: Diagnosis not present

## 2024-08-07 DIAGNOSIS — R051 Acute cough: Secondary | ICD-10-CM

## 2024-08-07 DIAGNOSIS — U071 COVID-19: Secondary | ICD-10-CM | POA: Diagnosis not present

## 2024-08-07 DIAGNOSIS — R928 Other abnormal and inconclusive findings on diagnostic imaging of breast: Secondary | ICD-10-CM

## 2024-08-07 DIAGNOSIS — R059 Cough, unspecified: Secondary | ICD-10-CM | POA: Diagnosis present

## 2024-08-07 LAB — RESP PANEL BY RT-PCR (RSV, FLU A&B, COVID)  RVPGX2
Influenza A by PCR: NEGATIVE
Influenza B by PCR: NEGATIVE
Resp Syncytial Virus by PCR: NEGATIVE
SARS Coronavirus 2 by RT PCR: POSITIVE — AB

## 2024-08-07 MED ORDER — IPRATROPIUM-ALBUTEROL 0.5-2.5 (3) MG/3ML IN SOLN
3.0000 mL | Freq: Once | RESPIRATORY_TRACT | Status: AC
  Administered 2024-08-07 (×2): 3 mL via RESPIRATORY_TRACT
  Filled 2024-08-07: qty 3

## 2024-08-07 MED ORDER — GUAIFENESIN-DM 100-10 MG/5ML PO SYRP: 5.0000 mL | ORAL_SOLUTION | ORAL | 0 refills | Status: AC | PRN

## 2024-08-07 MED ORDER — GUAIFENESIN-CODEINE 100-10 MG/5ML PO SOLN
5.0000 mL | Freq: Once | ORAL | Status: AC
  Administered 2024-08-07 (×2): 5 mL via ORAL
  Filled 2024-08-07: qty 5

## 2024-08-07 MED ORDER — PAXLOVID (300/100) 20 X 150 MG & 10 X 100MG PO TBPK: 3.0000 | ORAL_TABLET | Freq: Two times a day (BID) | ORAL | 0 refills | Status: AC

## 2024-08-07 NOTE — Discharge Instructions (Addendum)
 It was a pleasure caring for you today in the emergency department.  Recommend you stop smoking  Your today likely secondary to COVID-19 infection.  Recommend you remain at home, isolate while you are symptomatic.  Be sure to drink plenty of fluids, get plenty rest, return for any worsening or symptoms.  You can take over-the-counter acetaminophen  or ibuprofen for body aches.  Please follow-up with your PCP for ongoing care

## 2024-08-07 NOTE — ED Provider Notes (Signed)
 [facility] Provider Note  CSN: [id number] Arrival date & time: 08/07/24 2028  Chief Complaint(s) Flu like symptoms   HPI Jenna Jenkins is a 53 y.o. female with past medical history as below, significant for HIV who presents to the ED with complaint of cough  Patient reports has been feeling unwell over the past 1 to 2 days.  Nonproductive cough, body aches, subjective fevers, chills.  Poor appetite.  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  She has poor appetite but is able to tolerate p.o. intake.  No vomiting.  No changes to bowel or bladder function.  No recent travel  Past Medical History Past Medical History:  Diagnosis Date   HIV (human immunodeficiency virus infection) (HCC)    There are no active problems to display for this patient.  Home Medication(s) Prior to Admission medications   Medication Sig Start Date End Date Taking? Authorizing Provider  guaiFENesin -dextromethorphan (ROBITUSSIN DM) 100-10 MG/5ML syrup Take 5 mLs by mouth every 4 (four) hours as needed for cough. 08/07/24  Yes Elnor Jayson LABOR, DO  nirmatrelvir/ritonavir (PAXLOVID , 300/100,) 20 x 150 MG & 10 x 100MG  TBPK Take 3 tablets by mouth 2 (two) times daily for 5 days. Patient GFR is 60. Take nirmatrelvir (150 mg) two tablets twice daily for 5 days and ritonavir (100 mg) one tablet twice daily for 5 days. 08/07/24 08/12/24 Yes Elnor Jayson LABOR, DO  cyclobenzaprine  (FLEXERIL ) 10 MG tablet Take 1 tablet (10 mg total) by mouth 2 (two) times daily as needed for muscle spasms. 09/14/22   Tegeler, Lonni PARAS, MD  lidocaine  (LIDODERM ) 5 % Place 1 patch onto the skin daily. Remove & Discard patch within 12 hours or as directed by MD 09/14/22   Tegeler, Lonni PARAS, MD  naproxen  (NAPROSYN ) 500 MG tablet Take 1 tablet (500 mg total) by mouth 2 (two) times daily. 10/24/20    Geiple, Joshua, PA-C  ondansetron  (ZOFRAN -ODT) 4 MG disintegrating tablet Take 1 tablet (4 mg total) by mouth every 8 (eight) hours as needed for nausea or vomiting. 01/13/24   Schutt, Marsa CHRISTELLA RIGGERS                                                                                                                                    Past Surgical History History reviewed. No pertinent surgical history. Family History History reviewed. No pertinent family history.  Social History Social History   Tobacco Use   Smoking status: Every Day    Current packs/day: 0.50    Types: Cigarettes   Smokeless tobacco: Never  Vaping Use   Vaping status: Never Used  Substance Use Topics   Alcohol use: Never   Drug use: Never   Allergies Patient has no known allergies.  Review of Systems A  thorough review of systems was obtained and all systems are negative except as noted in the HPI and PMH.   Physical Exam Vital Signs  I have reviewed the triage vital signs BP (!) 142/73 (BP Location: Left Arm)   Pulse 87   Temp 99.6 F (37.6 C) (Oral)   Resp 20   Ht 6' 1 (1.854 m)   Wt 115.7 kg   SpO2 100%   BMI 33.65 kg/m  Physical Exam Vitals and nursing note reviewed.  Constitutional:      General: She is not in acute distress.    Appearance: Normal appearance. She is obese.  HENT:     Head: Normocephalic and atraumatic.     Right Ear: External ear normal.     Left Ear: External ear normal.     Nose: Nose normal.     Mouth/Throat:     Mouth: Mucous membranes are moist.  Eyes:     General: No scleral icterus.       Right eye: No discharge.        Left eye: No discharge.  Cardiovascular:     Rate and Rhythm: Normal rate and regular rhythm.     Pulses: Normal pulses.     Heart sounds: Normal heart sounds.  Pulmonary:     Effort: Pulmonary effort is normal. No respiratory distress.     Breath sounds: Normal breath sounds. No stridor.  Abdominal:     General: Abdomen is flat. There is no  distension.     Palpations: Abdomen is soft.     Tenderness: There is no abdominal tenderness.  Musculoskeletal:     Cervical back: No rigidity.     Right lower leg: No edema.     Left lower leg: No edema.  Skin:    General: Skin is warm and dry.     Capillary Refill: Capillary refill takes less than 2 seconds.  Neurological:     Mental Status: She is alert.  Psychiatric:        Mood and Affect: Mood normal.        Behavior: Behavior normal. Behavior is cooperative.     ED Results and Treatments Labs (all labs ordered are listed, but only abnormal results are displayed) Labs Reviewed  RESP PANEL BY RT-PCR (RSV, FLU A&B, COVID)  RVPGX2 - Abnormal; Notable for the following components:      Result Value   SARS Coronavirus 2 by RT PCR POSITIVE (*)    All other components within normal limits                                                                                                                          Radiology DG Chest 2 View Result Date: 08/07/2024 CLINICAL DATA:  Cough for 2 days EXAM: CHEST - 2 VIEW COMPARISON:  09/14/2022 FINDINGS: Cardiac shadow is within normal limits. Lungs are well aerated bilaterally. Scarring in the right mid lung is seen. No focal infiltrate or  effusion is seen. No bony abnormality is noted. IMPRESSION: No active cardiopulmonary disease. Electronically Signed   By: Oneil Devonshire M.D.   On: 08/07/2024 22:03   MM 3D DIAGNOSTIC MAMMOGRAM UNILATERAL RIGHT BREAST Result Date: 08/07/2024 CLINICAL DATA:  Recalled from screening mammography, possible asymmetry in the lower RIGHT breast at anterior depth visible only on the MLO view. EXAM: DIGITAL DIAGNOSTIC UNILATERAL RIGHT MAMMOGRAM WITH TOMOSYNTHESIS AND CAD TECHNIQUE: Right digital diagnostic mammography and breast tomosynthesis was performed. The images were evaluated with computer-aided detection. COMPARISON:  Previous exam(s). ACR Breast Density Category b: There are scattered areas of fibroglandular  density. FINDINGS: Spot compression MLO view of the area of concern and full field MLO and mediolateral views were obtained. The asymmetry in the lower breast questioned on screening mammography disperses with compression, indicating fibroglandular tissue. There is no underlying mass or architectural distortion. No findings suspicious for malignancy. IMPRESSION: No mammographic evidence of malignancy involving the RIGHT breast. RECOMMENDATION: Screening mammogram in one year.(Code:SM-B-01Y) I have discussed the findings and recommendations with the patient. If applicable, a reminder letter will be sent to the patient regarding the next appointment. BI-RADS CATEGORY  1: Negative. Electronically Signed   By: Debby Satterfield M.D.   On: 08/07/2024 15:40    Pertinent labs & imaging results that were available during my care of the patient were reviewed by me and considered in my medical decision making (see MDM for details).  Medications Ordered in ED Medications  ipratropium-albuterol  (DUONEB) 0.5-2.5 (3) MG/3ML nebulizer solution 3 mL (has no administration in time range)  guaiFENesin -codeine  100-10 MG/5ML solution 5 mL (5 mLs Oral Given 08/07/24 2209)                                                                                                                                     Procedures Procedures  (including critical care time)  Medical Decision Making / ED Course    Medical Decision Making:    Jeanene Mena is a 53 y.o. female  with past medical history as below, significant for HIV who presents to the ED with complaint of cough. The complaint involves an extensive differential diagnosis and also carries with it a high risk of complications and morbidity.  Serious etiology was considered. Ddx includes but is not limited to: COVID-19, influenza, viral URI, pneumonia, etc.  Complete initial physical exam performed, notably the patient was in no acute distress, no hypoxia.    Reviewed and  confirmed nursing documentation for past medical history, family history, social history.  Vital signs reviewed.    COVID-19 Acute cough > - Viral swab is positive for COVID-19, chest x-ray did not show any evidence of pneumonia - She is tolerating p.o. intake without difficulty, she is HDS, no fever, no hypoxia - She is feeling better after antitussive  - Patient does not meet criteria for admission at this time given stable vital signs, tolerant p.o., hypoxia. -  She is amenable to Paxlovid , will give nebulized breathing treatment, encouraged her to use her albuterol  inhaler at home as needed, and also antitussive  at home.  Patient in no distress and overall condition is stable. Detailed discussions were had with the patient/guardian regarding current findings, and need for close f/u with PCP or on call doctor. The patient/guardian has been instructed to return immediately if the symptoms worsen in any way for re-evaluation. Patient/guardian verbalized understanding and is in agreement with current care plan. All questions answered prior to discharge.                      Additional history obtained: -Additional history obtained from na -External records from outside source obtained and reviewed including: Chart review including previous notes, labs, imaging, consultation notes including  Prior labs, home medication   Lab Tests: -I ordered, reviewed, and interpreted labs.   The pertinent results include:   Labs Reviewed  RESP PANEL BY RT-PCR (RSV, FLU A&B, COVID)  RVPGX2 - Abnormal; Notable for the following components:      Result Value   SARS Coronavirus 2 by RT PCR POSITIVE (*)    All other components within normal limits    Notable for COVID-19  EKG   EKG Interpretation Date/Time:    Ventricular Rate:    PR Interval:    QRS Duration:    QT Interval:    QTC Calculation:   R Axis:      Text Interpretation:           Imaging Studies ordered: I  ordered imaging studies including chest x-ray I independently visualized the following imaging with scope of interpretation limited to determining acute life threatening conditions related to emergency care; findings noted above I agree with the radiologist interpretation If any imaging was obtained with contrast I closely monitored patient for any possible adverse reaction a/w contrast administration in the emergency department   Medicines ordered and prescription drug management: Meds ordered this encounter  Medications   guaiFENesin -codeine  100-10 MG/5ML solution 5 mL   ipratropium-albuterol  (DUONEB) 0.5-2.5 (3) MG/3ML nebulizer solution 3 mL   nirmatrelvir/ritonavir (PAXLOVID , 300/100,) 20 x 150 MG & 10 x 100MG  TBPK    Sig: Take 3 tablets by mouth 2 (two) times daily for 5 days. Patient GFR is 60. Take nirmatrelvir (150 mg) two tablets twice daily for 5 days and ritonavir (100 mg) one tablet twice daily for 5 days.    Dispense:  30 tablet    Refill:  0   guaiFENesin -dextromethorphan (ROBITUSSIN DM) 100-10 MG/5ML syrup    Sig: Take 5 mLs by mouth every 4 (four) hours as needed for cough.    Dispense:  118 mL    Refill:  0    -I have reviewed the patients home medicines and have made adjustments as needed   Consultations Obtained: na   Cardiac Monitoring: Continuous pulse oximetry interpreted by myself, 100% on ra.    Social Determinants of Health:  Diagnosis or treatment significantly limited by social determinants of health: current smoker and obesity Counseled patient for approximately 3 minutes regarding smoking cessation. Discussed risks of smoking and how they applied and affected their visit here today. CPT code: 00593: intermediate counseling for smoking cessation     Reevaluation: After the interventions noted above, I reevaluated the patient and found that they have improved  Co morbidities that complicate the patient evaluation  Past Medical History:   Diagnosis Date   HIV (human immunodeficiency virus  infection) (HCC)       Dispostion: Disposition decision including need for hospitalization was considered, and patient discharged from emergency department.    Final Clinical Impression(s) / ED Diagnoses Final diagnoses:  COVID-19  Acute cough        Elnor Jayson LABOR, DO 08/07/24 2247

## 2024-08-07 NOTE — ED Triage Notes (Signed)
 Pt states been coughing since yesterday, today has been fatigued with chills, bodyaches

## 2025-02-11 ENCOUNTER — Ambulatory Visit: Admitting: Physical Therapy
